# Patient Record
Sex: Female | Born: 1985 | Hispanic: No | Marital: Single | Smoking: Current every day smoker
Health system: Southern US, Community
[De-identification: ages and names within clinical notes are randomized; demographics above are authoritative.]

## PROBLEM LIST (undated history)

## (undated) HISTORY — PX: LEEP: SHX91

## (undated) HISTORY — PX: CHOLECYSTECTOMY: SHX55

---

## 2015-06-03 ENCOUNTER — Other Ambulatory Visit (HOSPITAL_COMMUNITY): Payer: Self-pay | Admitting: Specialist

## 2015-06-03 DIAGNOSIS — Z8489 Family history of other specified conditions: Secondary | ICD-10-CM

## 2015-06-03 DIAGNOSIS — Z3A18 18 weeks gestation of pregnancy: Secondary | ICD-10-CM

## 2015-07-01 ENCOUNTER — Encounter (HOSPITAL_COMMUNITY): Payer: Self-pay

## 2015-07-01 ENCOUNTER — Ambulatory Visit (HOSPITAL_COMMUNITY)
Admission: RE | Admit: 2015-07-01 | Discharge: 2015-07-01 | Disposition: A | Discharge status: Home / Self Care | Payer: Medicaid Other | Source: Ambulatory Visit | Attending: Specialist | Admitting: Specialist

## 2015-07-01 ENCOUNTER — Other Ambulatory Visit (HOSPITAL_COMMUNITY): Payer: Self-pay | Admitting: Specialist

## 2015-07-01 ENCOUNTER — Other Ambulatory Visit (HOSPITAL_COMMUNITY): Payer: Self-pay | Admitting: *Deleted

## 2015-07-01 DIAGNOSIS — O35DXX0 Maternal care for other (suspected) fetal abnormality and damage, fetal gastrointestinal anomalies, not applicable or unspecified: Secondary | ICD-10-CM | POA: Insufficient documentation

## 2015-07-01 DIAGNOSIS — O358XX1 Maternal care for other (suspected) fetal abnormality and damage, fetus 1: Secondary | ICD-10-CM

## 2015-07-01 DIAGNOSIS — O283 Abnormal ultrasonic finding on antenatal screening of mother: Secondary | ICD-10-CM

## 2015-07-01 DIAGNOSIS — O352XX0 Maternal care for (suspected) hereditary disease in fetus, not applicable or unspecified: Secondary | ICD-10-CM | POA: Insufficient documentation

## 2015-07-01 DIAGNOSIS — Z315 Encounter for genetic counseling: Secondary | ICD-10-CM | POA: Insufficient documentation

## 2015-07-01 DIAGNOSIS — Z36 Encounter for antenatal screening of mother: Secondary | ICD-10-CM | POA: Diagnosis not present

## 2015-07-01 DIAGNOSIS — O352XX1 Maternal care for (suspected) hereditary disease in fetus, fetus 1: Secondary | ICD-10-CM

## 2015-07-01 DIAGNOSIS — Z8489 Family history of other specified conditions: Secondary | ICD-10-CM

## 2015-07-01 DIAGNOSIS — O358XX0 Maternal care for other (suspected) fetal abnormality and damage, not applicable or unspecified: Secondary | ICD-10-CM | POA: Insufficient documentation

## 2015-07-01 DIAGNOSIS — Z3A18 18 weeks gestation of pregnancy: Secondary | ICD-10-CM | POA: Insufficient documentation

## 2015-07-01 DIAGNOSIS — Z3689 Encounter for other specified antenatal screening: Secondary | ICD-10-CM

## 2015-07-01 LAB — CHROMOSOMES ANALYSIS FOR CF

## 2015-07-01 NOTE — Progress Notes (Signed)
Genetic Counseling  High-Risk Gestation Note  Appointment Date:  07/01/2015 Referred By: Glory Buff, MD Date of Birth:  1986-06-21 Partner: Cay Schillings [place]   Pregnancy History: [id number] Estimated Date of Delivery: 12/01/15 Estimated Gestational Age: 63w1dAttending: DViann Fish MD  I met with MichaelaTEarlhamand her partner, Michaela Smith for genetic counseling because of a family history of tuberous sclerosis complex (TSC).  In summary:  Reviewed the family history of TSC  Patient's oldest son has had genetic testing for TSC and was found to have an alteration in TSC2  Prenatal diagnosis of TSC via amniocentesis was declined  Patient elected to have a detailed ultrasound but declined fetal MRI and fetal echocardiogram  Ultrasound today revealed: EIF and echogenic bowel  Patient elected to have NIPS, CF carrier screening, and maternal serum infection titers for CMV and toxoplasmosis  We began by reviewing the family history in detail. Mr. CCandida Peelingreported that he has skin features of TSC, but has never had a formal evaluation for TSC.  He has a history of learning problems, behavioral problems, seizures, skin changes consistent with TSC and was found to have a cyst on his pancreas. Mr. CCandida Peelinghas one full brother who has skin features consistent with TSC, but Mr. CCandida Peelingis uncertain if he has other features of TSC. Mr. CCandida Peelinghas 4 maternal half siblings, one of whom has TSC and has two children with TSC. Another maternal half sibling died in her 356'sand had severe seizures. He has limited information about his siblings. Mr. CCandida Peelingbelieves that his mother also had TSC, but she was never formally diagnosed with the condition. His mother died from cirrhosis of the liver secondary to alcoholism. Mr. 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  We discussed that TSC involves abnormalities of the skin (including hypomelanotic macules, facial angiofibromas, shagreen patches, fibrous facial plaques, and ungual fibromas); brain (cortical tubers, subependymal nodules and subependymal giant cell astrocytomas, seizures, intellectual disability, and developmental delay); kidney (angiomyolipomas, cysts, renal cell carcinoma); heart (rhabdomyoma, arrhythmia); and lungs. The skin is affected in virtually 100% of individuals with TSC. Subependymal nodules occur in 90% of individuals and cortical or subcortial tubers in 70%. Subependymal giant cell astrocytomas occur in up to 14% of all individuals with TSC. More than 80% of individuals with TSC have seizures, and at least 50% have developmental delay or intellectual disabilities. They were counseled that 80% of individuals with TSC have renal lesions and 47-67% have cardiac rhabdomyomas. Up to 75% of affected individuals have retinal lesions.   They were counseled that there are two genes which have been implicated in the majority of cases of TSC. TSC1 codes for the protein hamartin and TSC 2 codes for the protein tuberin. Both are inherited in an autosomal dominant manner and have features consistent with tumor suppressor genes, and function according to Knudson's "two-hit" hypothesis. The clinical variability results from the random nature of the second "hit" in persons with a germline mutation in either TSC1 or TSC2. The phenotypes exhibited with a TSC2 mutation are thought to be more severe than with a TSC1 mutation. It is thought that the penetrance  of a TSC1 or TSC2 mutation is 100%, but that variable expressivity occurs.   Given the family history of TSC, and the  gene mutation in their son, it seems that Mr. Candida Smith also has TSC and would be expected to carry the same gene mutation as their older son.In this case, then, there would be a 50% chance, with each pregnancy, for a child to inherit this gene mutation and have TSC. They were counseled that while we could test prenatally to determine if a child inherited this gene mutation, we could not predict the expression of the condition in that child. We discussed the option of amniocentesis and genetic testing for the particular mutation identified in their son. We discussed the approximate 1 in 300-500 chance for pregnancy complication related to amniocentesis. They understand that ultrasound cannot rule out all birth defects or genetic syndromes, including TSC. This couple was advised of this limitation and stated that they are not interested in having prenatal diagnostic testing for TSC.  We discussed the option of serial ultrasound evaluation to check for cardiac rhabdomyomas. We also discussed the availability of both fetal echocardiogram and fetal MRI (3rd trimester of pregnancy). Ms. Sonier declined these options at this time.    A detailed anatomy ultrasound was performed today. The report will be documented separately. Ultrasound today revealed an echogenic intracardiac focus (EIF) and echogenic bowel. We discussed that the second trimester genetic sonogram is targeted at identifying features associated with aneuploidy.  It has evolved as a screening tool used to provide an individualized risk assessment for Down syndrome and other trisomies.  The ability of sonography to aid in the detection of aneuploidies relies on identification of both major structural anomalies and "soft markers."  The patient was counseled that the latter term refers to findings that are often normal variants and do not cause any significant medical problems.  Nonetheless, these markers have a known association with aneuploidy.     The patient was counseled that an EIF is characterized by calcified papillary muscle leading to a discreet dot in the left, or less commonly, the right ventricle.  We discussed that this finding is typically considered to be a benign variant; however, the risk of aneuploidy is increased when this marker is found in patients who have additional risk factors for fetal aneuploidy (AMA, abnormal screening test, or other markers or anomalies by fetal ultrasound). In addition, the patient was counseled that the bowel is defined as being echogenic if it is as bright as adjacent bone.  Echogenic bowel is present in ~0.6-2.4% of normal fetuses, but is associated with a six fold increase in risk for aneuploidy.  In addition, it is associated with a 2% risk for fetal cystic fibrosis, 3% risk for a congenital infection, and a 3% risk for a bleed.    We reviewed Ms. Lansky's normal maternal serum Quad screen result and the associated 1 in 3980 risk for fetal Down syndrome.  Considering the ultrasound finding of two soft markers for fetal aneuploidy, the adjusted risk for fetal Down syndrome is ~1 in 400.  We reviewed chromosomes, nondisjunction, and the common features and variable prognosis of Down syndrome.  We also reviewed other aneuploidies including trisomies 49 and 108; however, we discussed that these conditions are less likely given that the remainder of the fetal anatomy was within normal limits by ultrasound.  We reviewed other available screening and diagnostic options including noninvasive prenatal screening (NIPS)/cell free fetal DNA (cffDNA) testing, and amniocentesis.  She  was counseled regarding the benefits and limitations of each option.  We reviewed the approximate 1 in 338-329 risk for complications for amniocentesis, including spontaneous pregnancy loss.  After thoughtful consideration of her options, Ms. Boan elected to have NIPS (Panorama).   Ms. Hassing was then counseled regarding the  availability of maternal serum infectious testing, to determine whether or not she has been exposed to specific infections such as CMV and toxoplasmosis as well as the availability of carrier screening for cystic fibrosis (CF).  We reviewed that CF carrier testing involves analysis of the most common CFTR gene changes.  A negative test result reduces the likelihood of carrier status, but does not eliminate the chance.  We reviewed the autosomal recessive inheritance of CF and the availability of carrier testing for her husband and/or prenatal diagnosis, if warranted/desired.  After consideration of these options, Ms. Latka elected to have both maternal serum infectious titers and CF carrier testing.   By ultrasound, the fetus did not have obvious signs of TSC. This couple understands that a normal ultrasound does not reduce the likelihood of TSC in the fetus as some signs may develop later during the pregnancy, or might not ever be visualized by ultrasound. This couple expressed that they would like to pursue postnatal testing for TSC performed for their baby. We discussed that her son's testing was performed at [facility] [phone]). She understands that either peripheral blood or cord blood could be retrieved in a purple top (EDTA) tube and sent to [facility] for analysis of the TSC2 variant present in the family ( Transition G > A at nucleotide position 2714).  The family histories were otherwise found to be noncontributory for birth defects, intellectual disability, and known genetic conditions. Without further information regarding the provided family history, an accurate genetic risk cannot be calculated. Further genetic counseling is warranted if more information is obtained.  Ms. Fiddler was provided with written information regarding cystic fibrosis (CF) including the carrier frequency and incidence in the Caucasian population, the availability of carrier testing and prenatal diagnosis if  indicated. In addition, we discussed that CF is routinely screened for as part of the [place] newborn screening panel. She declined testing today.   Ms. Kapusta denied exposure to environmental toxins or chemical agents. She denied the use of alcohol or street drugs, but reports smoking a pack of cigarettes per day. We discussed the implications of tobacco use during pregnancy and counseled regarding cessation.  She denied significant viral illnesses during the course of her pregnancy. Her medical and surgical histories were noncontributory.   I counseled this couple regarding the above risks and available options. The approximate face-to-face time with the genetic counselor was 65 minutes.  Electronically signed by: Filbert Schilder, [place]  Certified Genetic Counselor

## 2015-07-02 LAB — CMV ANTIBODY, IGG (EIA)

## 2015-07-02 LAB — CMV IGM

## 2015-07-02 LAB — TOXOPLASMA ANTIBODIES- IGG AND  IGM
TOXOPLASMA IGG RATIO: 108 [IU]/mL — AB (ref 0.0–7.1)
Toxoplasma Antibody- IgM: 5 AU/mL (ref 0.0–7.9)

## 2015-07-03 ENCOUNTER — Other Ambulatory Visit (HOSPITAL_COMMUNITY): Payer: Self-pay | Admitting: Specialist

## 2015-07-09 ENCOUNTER — Telehealth (HOSPITAL_COMMUNITY): Payer: Self-pay | Admitting: *Deleted

## 2015-07-09 ENCOUNTER — Other Ambulatory Visit (HOSPITAL_COMMUNITY): Payer: Self-pay | Admitting: Specialist

## 2015-07-09 NOTE — Telephone Encounter (Signed)
Called Michaela Smith with her CMV and Toxo antibody titers.  Verified by name and DOB.  Informed patient that these are WNL. Panoram and CF are still pending and our genetic counselor will call her with these results.  Pt verbalized understanding.

## 2015-07-11 ENCOUNTER — Telehealth (HOSPITAL_COMMUNITY): Payer: Self-pay | Admitting: MS"

## 2015-07-11 NOTE — Telephone Encounter (Signed)
Harlow Ohms, [facility] Genetic Counseling Intern called Michaela Smith to discuss her cell free DNA test results.  [name] had Panorama testing through [facility] laboratories.  Testing was offered because of ultrasound finding.   The patient was identified by name and DOB.  We reviewed that these are within normal limits, showing a less than 1 in 10,000 risk for trisomies 21, 18 and 13, and monosomy X (Turner syndrome).  In addition, the risk for triploidy/vanishing twin and sex chromosome trisomies (47,XXX and 47,XXY) was also low risk.  We reviewed that this testing identifies > 99% of pregnancies with trisomy 26, trisomy 56, sex chromosome trisomies (47,XXX and 47,XXY), and triploidy. The detection rate for trisomy 18 is 96%.  The detection rate for monosomy X is ~92%.  The false positive rate is <0.1% for all conditions. Testing was also consistent with female fetal sex.  She understands that this testing does not identify all genetic conditions.  All questions were answered to her satisfaction, she was encouraged to call with additional questions or concerns.  Quinn Plowman, MS Certified Genetic Counselor 07/11/2015 4:10 PM

## 2015-07-15 ENCOUNTER — Other Ambulatory Visit (HOSPITAL_COMMUNITY): Payer: Self-pay | Admitting: Specialist

## 2015-07-29 ENCOUNTER — Encounter (HOSPITAL_COMMUNITY): Payer: Self-pay

## 2015-07-29 ENCOUNTER — Ambulatory Visit (HOSPITAL_COMMUNITY)
Admission: RE | Admit: 2015-07-29 | Discharge: 2015-07-29 | Disposition: A | Discharge status: Home / Self Care | Payer: Medicaid Other | Source: Ambulatory Visit | Attending: Specialist | Admitting: Specialist

## 2015-07-29 VITALS — BP 105/66 | HR 91 | Wt 145.8 lb

## 2015-07-29 DIAGNOSIS — O358XX0 Maternal care for other (suspected) fetal abnormality and damage, not applicable or unspecified: Secondary | ICD-10-CM | POA: Diagnosis not present

## 2015-07-29 DIAGNOSIS — O283 Abnormal ultrasonic finding on antenatal screening of mother: Secondary | ICD-10-CM | POA: Insufficient documentation

## 2015-09-09 ENCOUNTER — Ambulatory Visit (HOSPITAL_COMMUNITY)
Admission: RE | Admit: 2015-09-09 | Discharge: 2015-09-09 | Disposition: A | Discharge status: Home / Self Care | Payer: Medicaid Other | Source: Ambulatory Visit | Attending: Specialist | Admitting: Specialist

## 2015-09-09 ENCOUNTER — Encounter (HOSPITAL_COMMUNITY): Payer: Self-pay

## 2015-09-09 DIAGNOSIS — O283 Abnormal ultrasonic finding on antenatal screening of mother: Secondary | ICD-10-CM | POA: Diagnosis present

## 2015-09-09 DIAGNOSIS — Z3A28 28 weeks gestation of pregnancy: Secondary | ICD-10-CM | POA: Insufficient documentation

## 2015-09-09 DIAGNOSIS — O358XX0 Maternal care for other (suspected) fetal abnormality and damage, not applicable or unspecified: Secondary | ICD-10-CM | POA: Diagnosis present

## 2015-09-09 DIAGNOSIS — Z36 Encounter for antenatal screening of mother: Secondary | ICD-10-CM | POA: Insufficient documentation

## 2015-09-09 DIAGNOSIS — Z8481 Family history of carrier of genetic disease: Secondary | ICD-10-CM | POA: Insufficient documentation

## 2015-10-21 ENCOUNTER — Other Ambulatory Visit (HOSPITAL_COMMUNITY): Payer: Self-pay | Admitting: Maternal and Fetal Medicine

## 2015-10-21 ENCOUNTER — Ambulatory Visit (HOSPITAL_COMMUNITY)
Admission: RE | Admit: 2015-10-21 | Discharge: 2015-10-21 | Disposition: A | Discharge status: Home / Self Care | Payer: Medicaid Other | Source: Ambulatory Visit | Attending: Specialist | Admitting: Specialist

## 2015-10-21 ENCOUNTER — Encounter (HOSPITAL_COMMUNITY): Payer: Self-pay

## 2015-10-21 VITALS — BP 97/50 | HR 91 | Wt 169.2 lb

## 2015-10-21 DIAGNOSIS — O358XX0 Maternal care for other (suspected) fetal abnormality and damage, not applicable or unspecified: Secondary | ICD-10-CM | POA: Diagnosis not present

## 2015-10-21 DIAGNOSIS — O283 Abnormal ultrasonic finding on antenatal screening of mother: Secondary | ICD-10-CM | POA: Insufficient documentation

## 2015-10-21 DIAGNOSIS — Z3A34 34 weeks gestation of pregnancy: Secondary | ICD-10-CM | POA: Insufficient documentation

## 2015-10-21 DIAGNOSIS — Z87898 Personal history of other specified conditions: Secondary | ICD-10-CM

## 2015-10-21 DIAGNOSIS — O352XX0 Maternal care for (suspected) hereditary disease in fetus, not applicable or unspecified: Secondary | ICD-10-CM

## 2015-11-18 ENCOUNTER — Ambulatory Visit (HOSPITAL_COMMUNITY)
Admission: RE | Admit: 2015-11-18 | Discharge: 2015-11-18 | Disposition: A | Discharge status: Home / Self Care | Payer: Medicaid Other | Source: Ambulatory Visit | Attending: Specialist | Admitting: Specialist

## 2015-11-18 ENCOUNTER — Other Ambulatory Visit (HOSPITAL_COMMUNITY): Payer: Self-pay | Admitting: Obstetrics and Gynecology

## 2015-11-18 DIAGNOSIS — O352XX0 Maternal care for (suspected) hereditary disease in fetus, not applicable or unspecified: Secondary | ICD-10-CM

## 2015-11-18 DIAGNOSIS — O358XX9 Maternal care for other (suspected) fetal abnormality and damage, other fetus: Secondary | ICD-10-CM

## 2015-11-18 DIAGNOSIS — Z3A38 38 weeks gestation of pregnancy: Secondary | ICD-10-CM | POA: Diagnosis not present

## 2015-11-18 DIAGNOSIS — O283 Abnormal ultrasonic finding on antenatal screening of mother: Secondary | ICD-10-CM | POA: Diagnosis not present

## 2015-11-18 DIAGNOSIS — O358XX0 Maternal care for other (suspected) fetal abnormality and damage, not applicable or unspecified: Secondary | ICD-10-CM | POA: Diagnosis present

## 2016-05-05 ENCOUNTER — Encounter (HOSPITAL_COMMUNITY): Payer: Self-pay | Admitting: *Deleted

## 2016-08-20 IMAGING — US US OB DETAIL+14 WK
1 series · 12 of 28 positions shown · non-contrast
Comparison: none

[Series 1: us ob detail+14 wk · 94 acquisitions, 12 frames shown]
[im 4/94]
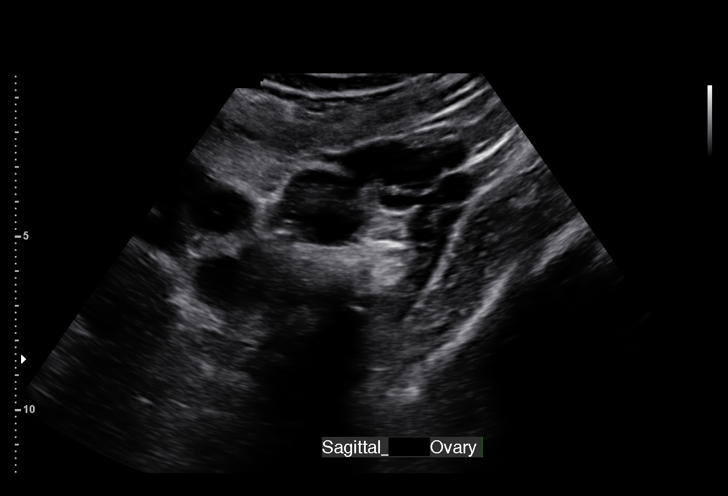
[im 11/94]
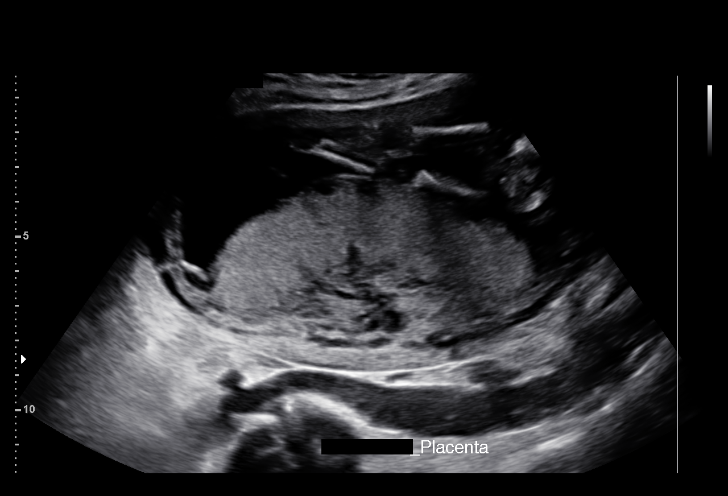
[im 18/94]
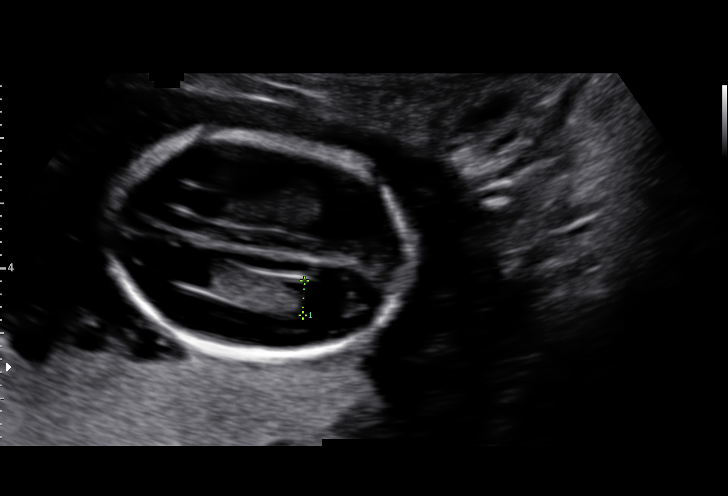
[im 28/94]
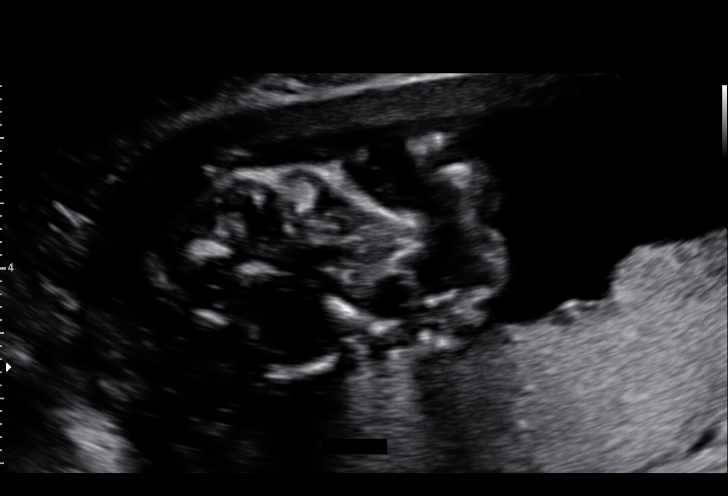
[im 35/94]
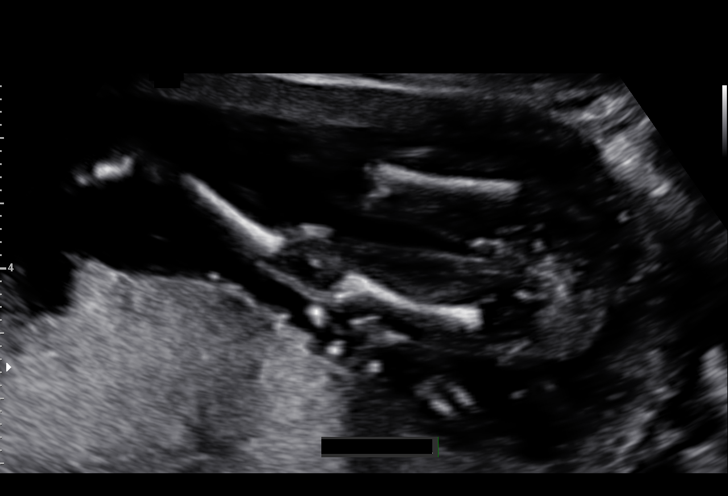
[im 42/94]
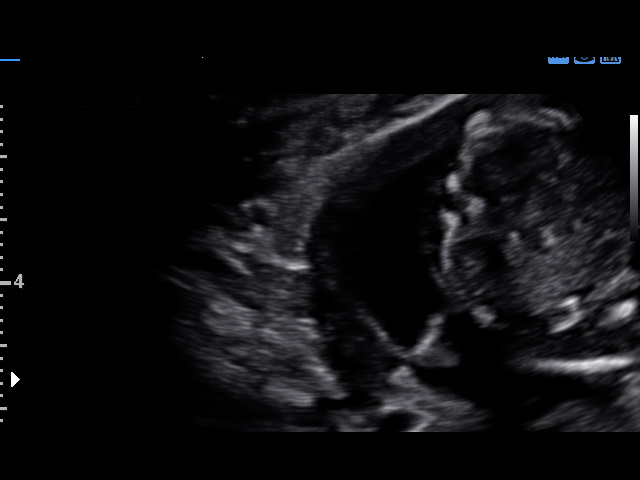
[im 52/94]
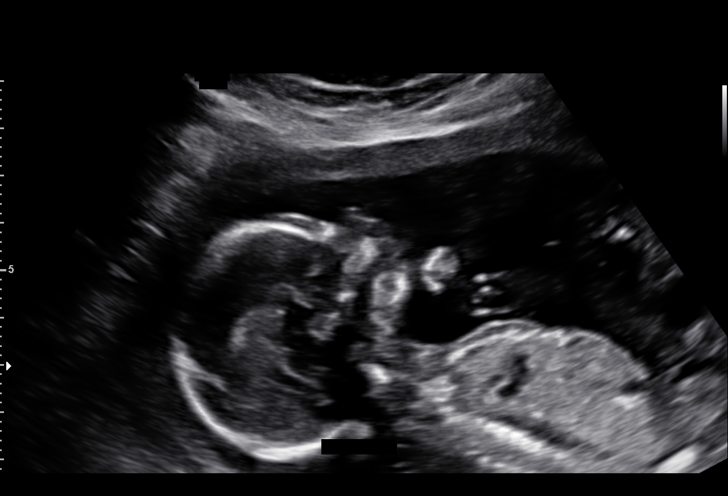
[im 59/94]
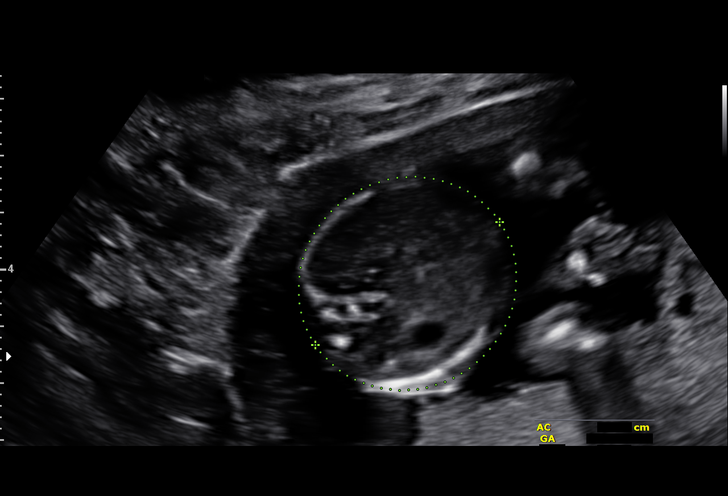
[im 66/94]
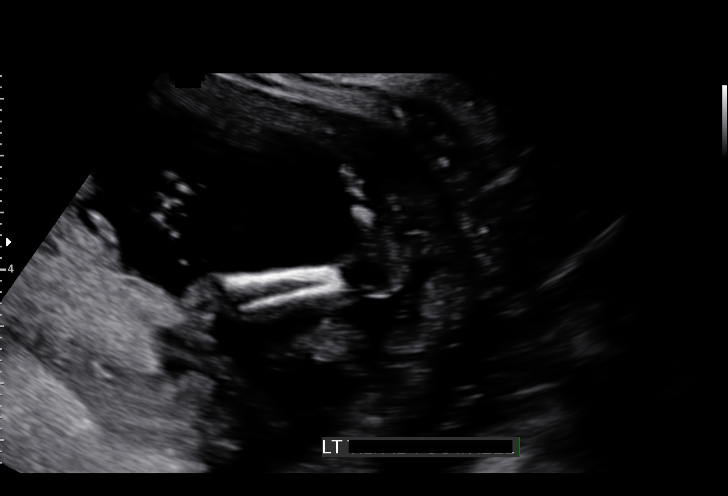
[im 76/94]
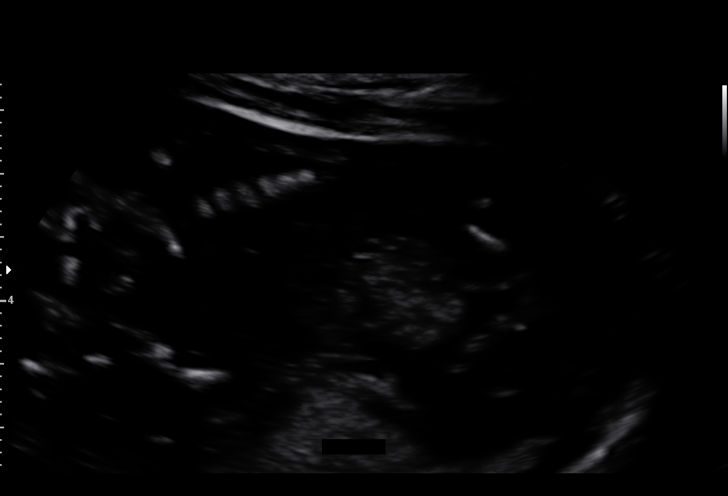
[im 83/94]
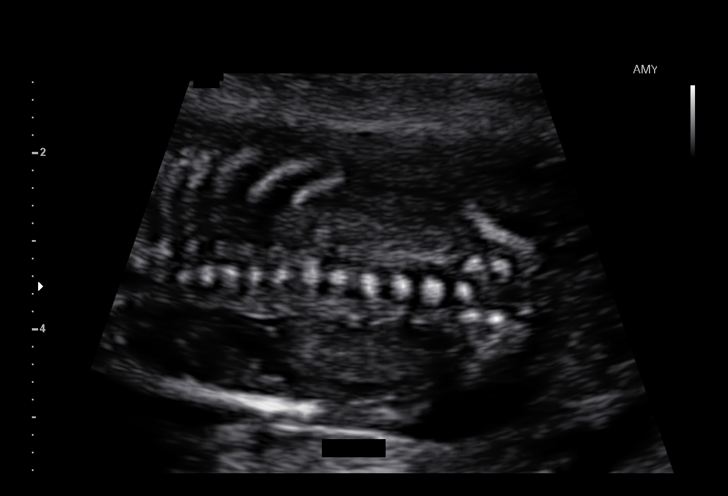
[im 90/94]
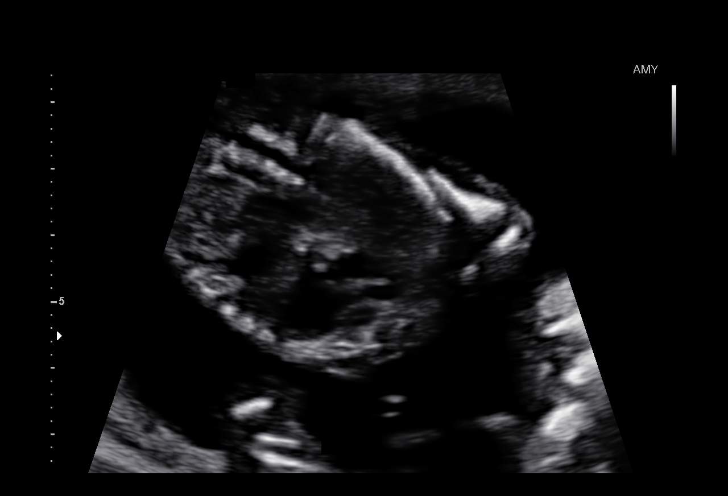

[12 of 28 positions shown; findings below may reference images not displayed]

OBSTETRICS REPORT
(Signed Final 07/01/2015 [DATE])

Name:       DOBROMIRA DRNORD                        Visit  07/01/2015 [DATE]
Date:

Service(s) Provided

US OB DETAIL + 14 WK                                   76811.0
Indications

Detailed fetal anatomic survey                         Z36
Family history of genetic disorder
18 weeks gestation of pregnancy
Fetal Evaluation

Num Of             1
Fetuses:
Fetal Heart        148                          bpm
Rate:
Cardiac Activity:  Observed
Presentation:      Variable
Placenta:          Posterior, above cervical
os
P. Cord            Visualized
Insertion:

Amniotic Fluid
AFI FV:      Subjectively within normal limits
Larg Pckt:      4.6  cm
Biometry

BPD:     32.9   m    G. Age:   16w 2d                 CI:         58.2   70 - 86
m
FL/HC:      16.4   15.8 -
18
HC:     140.4   m    G. Age:   17w 2d        11  %    HC/AC:      1.17   1.07 -
m
AC:     120.2   m    G. Age:   17w 5d        33  %    FL/BPD
m                                     :
FL:        23   m    G. Age:   16w 6d         9  %    FL/AC:      19.1   20 - 24
m
HUM:     22.9   m    G. Age:   17w 0d        22  %
m
CER:     16.6   m    G. Age:   16w 4d        15  %
m
NFT:       3.7  m
m

Est.         189   gm    0 lb 7 oz      30   %
FW:
Gestational Age

LMP:           18w 1d        Date:  02/24/15                  EDD:   12/01/15
U/S Today:     17w 0d                                         EDD:   12/09/15
Best:          18w 1d    Det. By:   LMP  (02/24/15)           EDD:   12/01/15
Anatomy

Cranium:          Appears normal         Aortic Arch:       Appears normal
Fetal Cavum:      Appears normal         Ductal Arch:       Not well visualized
Ventricles:       Appears normal         Diaphragm:         Appears normal
Choroid Plexus:   Appears normal         Stomach:           Appears normal,
left sided
Cerebellum:       Appears normal         Abdomen:           Echogenic Bowel
Posterior         Appears normal         Abdominal          Appears nml (cord
Fossa:                                   Wall:              insert, abd wall)
Nuchal Fold:      Appears normal         Cord Vessels:      Appears normal (3
vessel cord)
Face:             Appears normal         Kidneys:           Appear normal
(orbits and profile)
Lips:             Appears normal         Bladder:           Appears normal
Palate:           Not well visualized    Spine:             Appears normal
Heart:            Echogenic focus        Lower              Appears normal
in LV                  Extremities:
RVOT:             Not well visualized    Upper              Appears normal
Extremities:
LVOT:             Appears normal

Other:   Fetus appears to be a female. Heels and 5th digit appear normal.
Targeted Anatomy

Fetal Central Nervous System
Cisterna
Magna:
Cervix Uterus Adnexa

Cervical Length:    3.07      cm

Cervix:       Normal appearance by transabdominal scan.
Appears closed, without funnelling.

Left Ovary:    Within normal limits.
Right Ovary:   Within normal limits.
Comments

Echogenic focus of the left ventricle (EIF) and Echogenic
bowel were seen.

1. EIF: This finding has been implicated as a marker for
increased risk of aneuploidy, especially Trisomy 21.
However, it is also seen as a normal variant in 2-4% of all
pregnancy ultrasound exams. Although its significance as
an isolated finding is not entirely clear, a consensus
opinion would be that an isolated EIF (with no other risk
factors or suspicious ultrasound findings) does not
significantly increase risk of aneuploidy.

The echogenic focus is not a "heart defect" and no special
fetal surveillance or neonatal cardiac evaluation is
recommended.Hyperechogenic bowel was noted. A
targeted survey of the fetal anatomy was therefore
performed and no other dysmorphic features, or
morphologic "soft markers" associated with aneuploidy,
were identified.

2. Echogenic bowel: Hyperechogenic bowel is observed in
about 0.5% of second trimester fetuses and is usually of no
pathological significance. The most common cause is intra-
amniotic bleeding, and accordingly there may be an
increased risk of later fetal growth restriction and/or
unexplained fetal demise. However, echogenic bowel is
also known to be a marker, in some cases, for (a) cystic
fibrosis, (b) chromosomal abnormalities or (c) infection (eg,
CMV and Toxoplasmosis). For isolated hyperechogenic
bowel, the risk for trisomy 21 is thought to be 7 - 10 times
the background risk. Cystic fibrosis risk, in this setting, is
not as well quantified, but is thought to be significantly
elevated as well.
Impression

SIUP at 77w7d on attempted comprehensive fetal survey
EFW 30th%'le
Echogenic intracardiac focus, left ventricle
Echogenic bowel
no previa
amniotic fluid is gestational age appropriate
cervix is long and closed, measuring over 3cm in functional
length
Recommendations

1. see genetic counseling
2. interval growth in 4 weeks.
3. Panorama drawn
4. infectious serology drawn
5. CF carrier screen drawn
6. patient declines antenatal MRI despite risk for tuberous
sclerosis (per patient's history)

## 2016-09-17 IMAGING — US US MFM OB FOLLOW-UP
2 series · 14 of 28 positions shown · non-contrast
Comparison: none

[Series 1: us mfm ob follow-up · 65 acquisitions, 12 frames shown (1 of 2)]
[im 3/65]
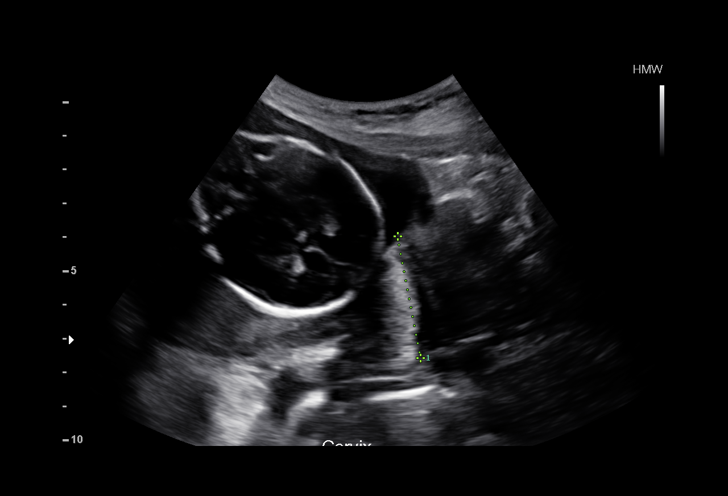
[im 9/65]
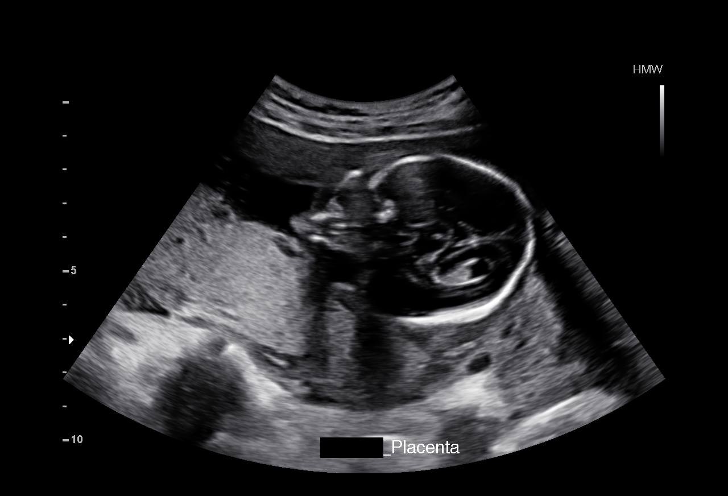
[im 14/65]
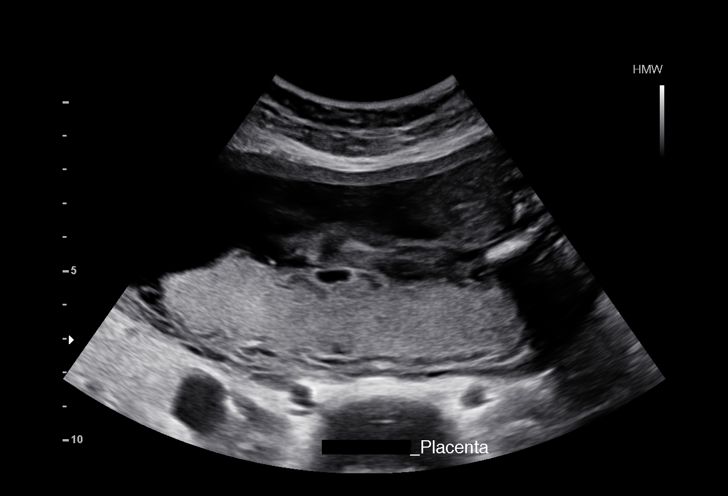
[im 19/65]
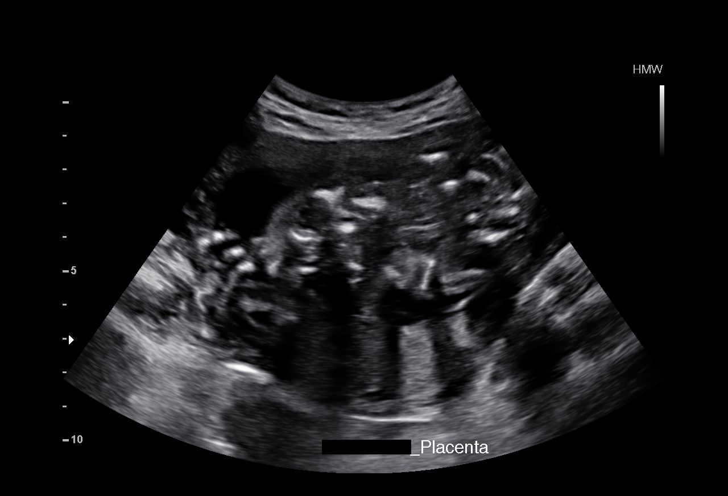
[im 25/65]
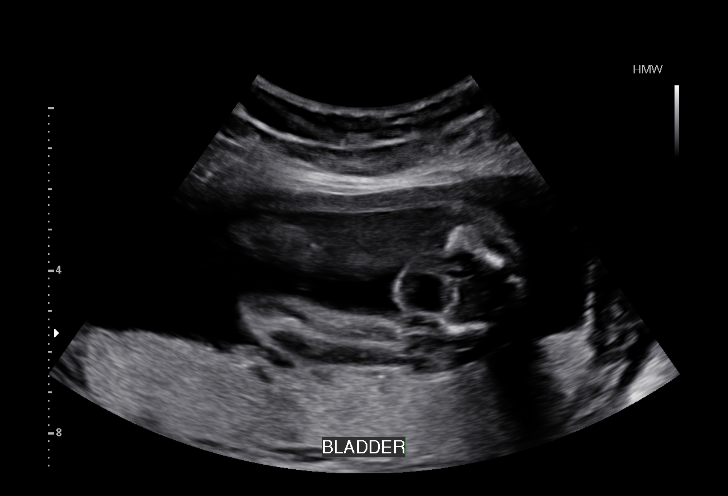
[im 30/65]
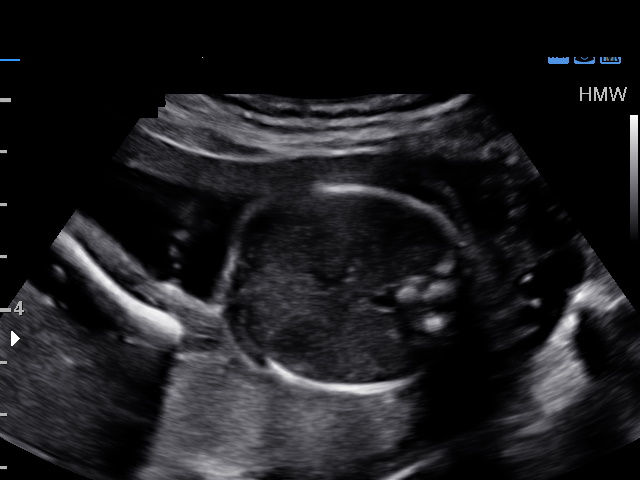
[im 35/65]
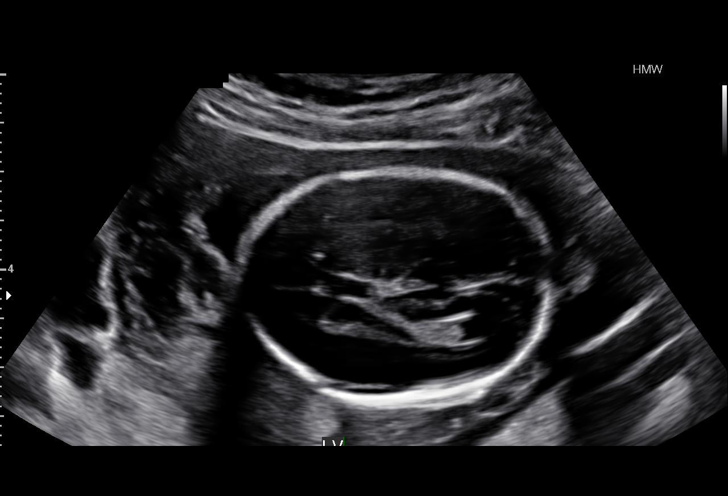
[im 41/65]
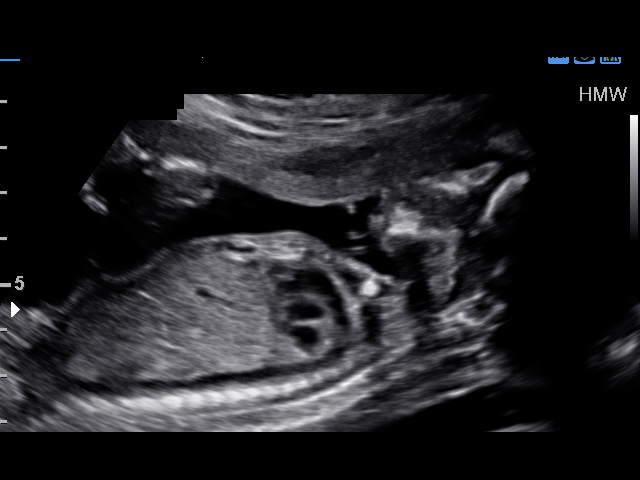
[im 46/65]
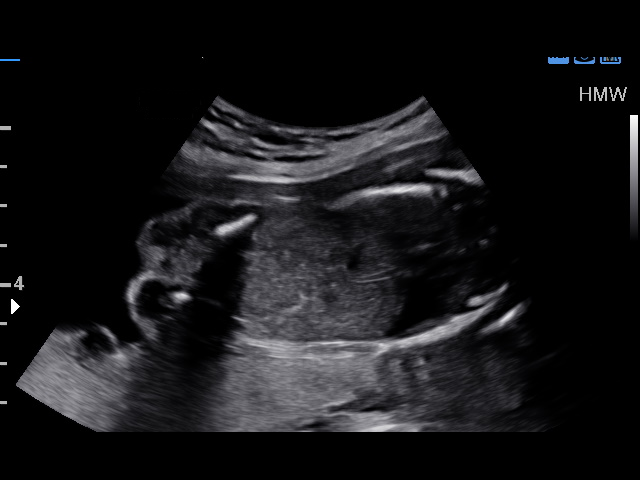
[im 51/65]
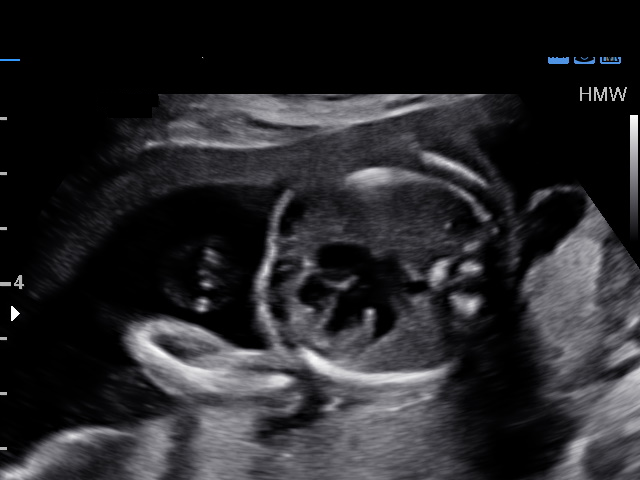
[im 57/65]
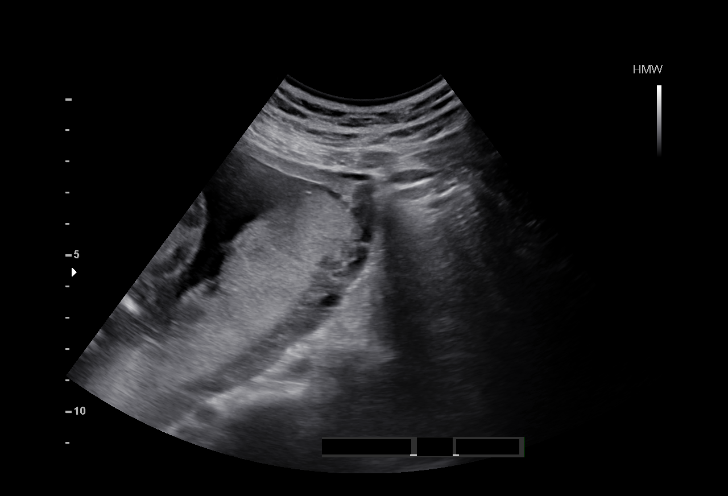
[im 62/65]
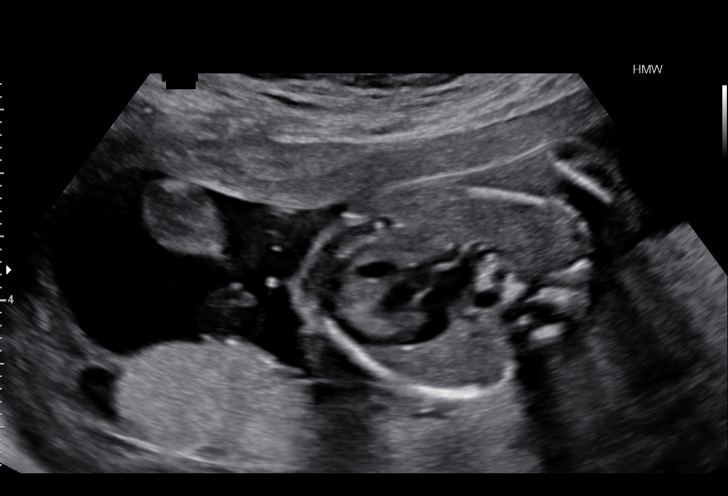

[Series 3: us mfm ob follow-up · 7 acquisitions, 2 frames shown (2 of 2)]
[im 1/7]
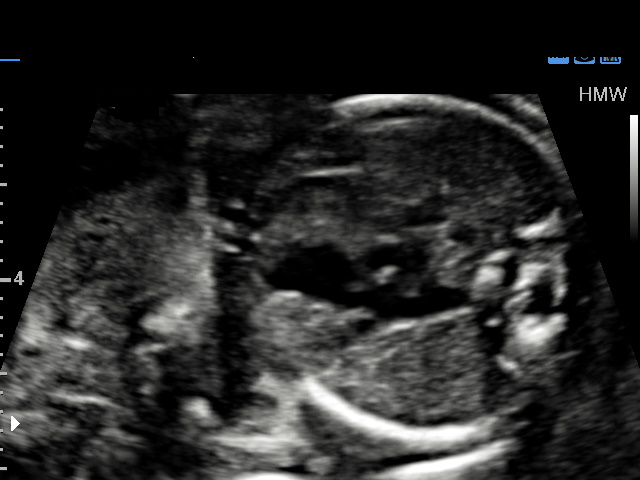
[im 7/7]
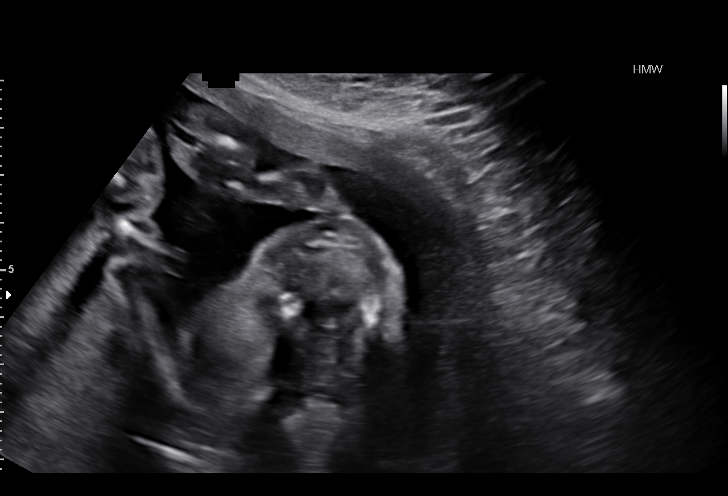

[14 of 28 positions shown; findings below may reference images not displayed]

OBSTETRICS REPORT
(Signed Final 07/29/2015 [DATE])

Name:       MOLADAD JEN                        Visit  07/29/2015 [DATE]
Date:

Service(s) Provided

Indications

22 weeks gestation of pregnancy
Family history of genetic disorder; TSC (tuberous
sclerosis complex) - fetus with 50% chance of
being affected
Follow-up incomplete fetal anatomic evaluation         Z36
Echogenic bowel; normal work-up: NIPS; CF;
viral studies for acute infection
Fetal Evaluation

Num Of             1
Fetuses:
Fetal Heart        151                          bpm
Rate:
Cardiac Activity:  Observed
Presentation:      Cephalic
Placenta:          Posterior, above cervical
os
P. Cord            Visualized, central
Insertion:

Amniotic Fluid
AFI FV:      Subjectively within normal limits
Larg Pckt:      3.4  cm
Biometry

BPD:     45.8   m    G. Age:   19w 6d                 CI:        65.14   70 - 86
m
FL/HC:      19.1   18.4 -
20.2
HC:     182.3   m    G. Age:   20w 4d       < 3  %    HC/AC:      1.15   1.06 -
m
AC:     158.5   m    G. Age:   21w 0d        12  %    FL/BPD      76.2   71 - 87
m                                     :
FL:      34.9   m    G. Age:   21w 0d        10  %    FL/AC:      22.0   20 - 24
m
HUM:     32.6   m    G. Age:   21w 0d        19  %
m

Est.         386   gm   0 lb 14 oz      20   %
FW:
Gestational Age

LMP:           22w 1d        Date:  02/24/15                  EDD:   12/01/15
U/S Today:     20w 4d                                         EDD:   12/12/15
Best:          22w 1d    Det. By:   LMP  (02/24/15)           EDD:   12/01/15
Anatomy

Cranium:          Appears normal         Aortic Arch:       Previously seen
Fetal Cavum:      Previously seen        Ductal Arch:       Appears normal
Ventricles:       Appears normal         Diaphragm:         Previously seen
Choroid Plexus:   Previously seen        Stomach:           Appears normal,
left sided
Cerebellum:       Previously seen        Abdomen:           Appears normal
Posterior         Previously seen        Abdominal          Previously seen
Fossa:                                   Wall:
Nuchal Fold:      Previously seen        Cord Vessels:      Previously seen
Face:             Orbits and profile     Kidneys:           Appear normal
previously seen
Lips:             Previously seen        Bladder:           Appears normal
Palate:           Not well visualized    Spine:             Previously seen
Heart:            Appears normal         Lower              Previously seen
(4CH, axis, and        Extremities:
situs)
RVOT:             Appears normal         Upper              Previously seen
Extremities:
LVOT:             Appears normal

Other:   Fetus appears to be a female. Heels and 5th digit appear normal
previuously.
Cervix Uterus Adnexa

Cervical Length:    3.5       cm

Cervix:       Normal appearance by transabdominal scan.
Uterus:       No abnormality visualized.
Cul De Sac:   No free fluid seen.

Left Ovary:    Not visualized.
Right Ovary:   Not visualized.

Adnexa:     No abnormality visualized.
Impression

SIUP at 22+1 weeks
Normal interval anatomy; anatomic survey complete
Normal amniotic fluid volume
EFW at the 20th %tile; AC at the 12th %tile
Recommendations
Follow-up ultrasound for growth in 4-6 weeks
